# Patient Record
Sex: Female | Born: 1950 | Race: White | Hispanic: No | Marital: Married | Smoking: Never smoker
Health system: Southern US, Community
[De-identification: ages and names within clinical notes are randomized; demographics above are authoritative.]

## PROBLEM LIST (undated history)

## (undated) DIAGNOSIS — I10 Essential (primary) hypertension: Secondary | ICD-10-CM

---

## 2006-04-20 ENCOUNTER — Emergency Department (HOSPITAL_COMMUNITY): Admission: EM | Admit: 2006-04-20 | Discharge: 2006-04-20 | Payer: Self-pay | Admitting: Emergency Medicine

## 2008-03-07 ENCOUNTER — Emergency Department (HOSPITAL_COMMUNITY): Admission: EM | Admit: 2008-03-07 | Discharge: 2008-03-07 | Payer: Self-pay | Admitting: Emergency Medicine

## 2011-12-05 ENCOUNTER — Other Ambulatory Visit (HOSPITAL_COMMUNITY): Payer: Self-pay | Admitting: Family Medicine

## 2011-12-05 DIAGNOSIS — Z1231 Encounter for screening mammogram for malignant neoplasm of breast: Secondary | ICD-10-CM

## 2013-12-10 ENCOUNTER — Other Ambulatory Visit: Payer: Self-pay | Admitting: Nurse Practitioner

## 2013-12-10 ENCOUNTER — Ambulatory Visit
Admission: RE | Admit: 2013-12-10 | Discharge: 2013-12-10 | Disposition: A | Discharge status: Home / Self Care | Payer: BC Managed Care – PPO | Source: Ambulatory Visit | Attending: Nurse Practitioner | Admitting: Nurse Practitioner

## 2013-12-10 DIAGNOSIS — R52 Pain, unspecified: Secondary | ICD-10-CM

## 2015-12-19 IMAGING — CR DG HIP COMPLETE 2+V*R*
2 series · 2 of 2 positions shown · non-contrast
Comparison: None.

CLINICAL DATA: Increase pain, no trauma

EXAM:
RIGHT HIP - COMPLETE 2+ VIEW

[view not recorded (1 of 2)]
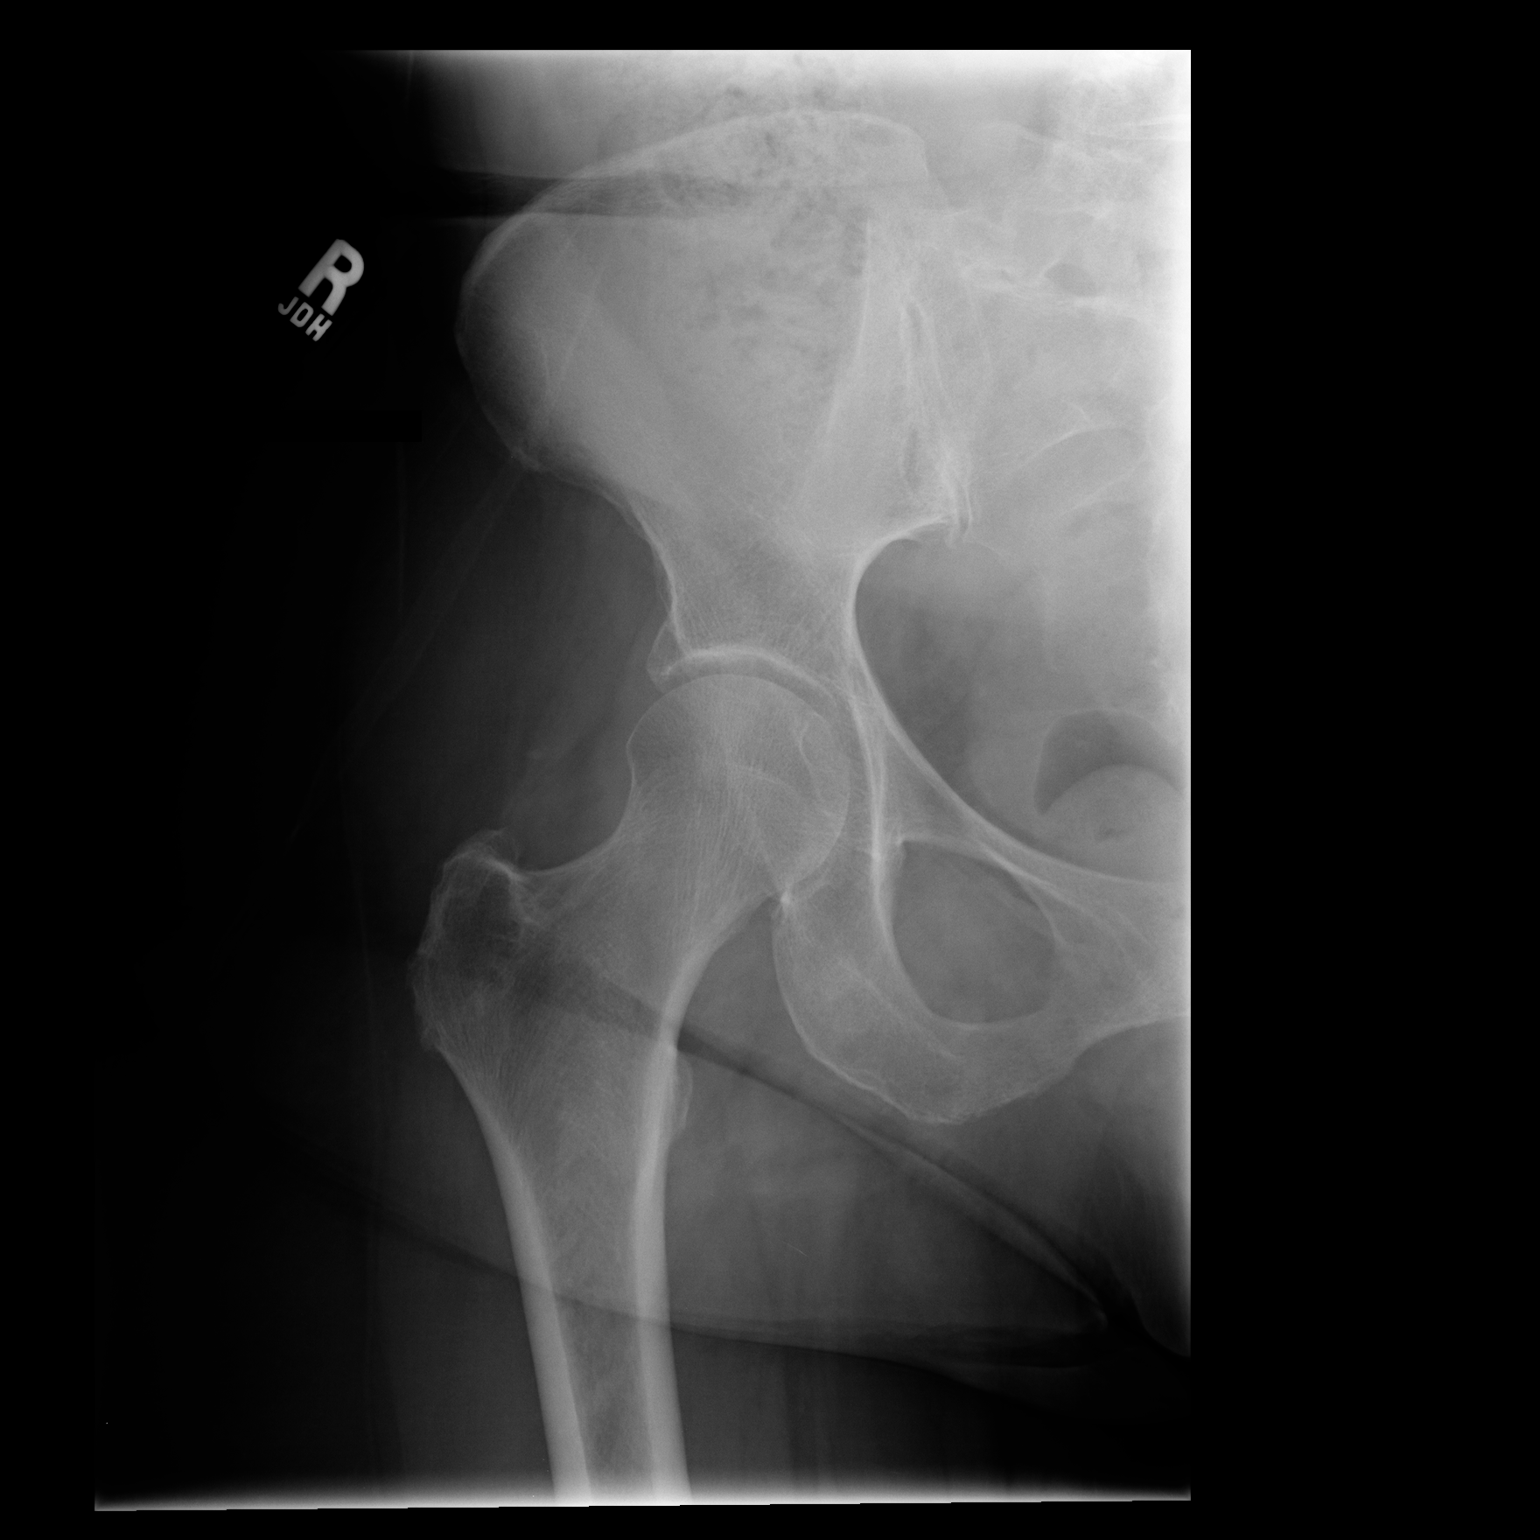

[view not recorded (2 of 2)]
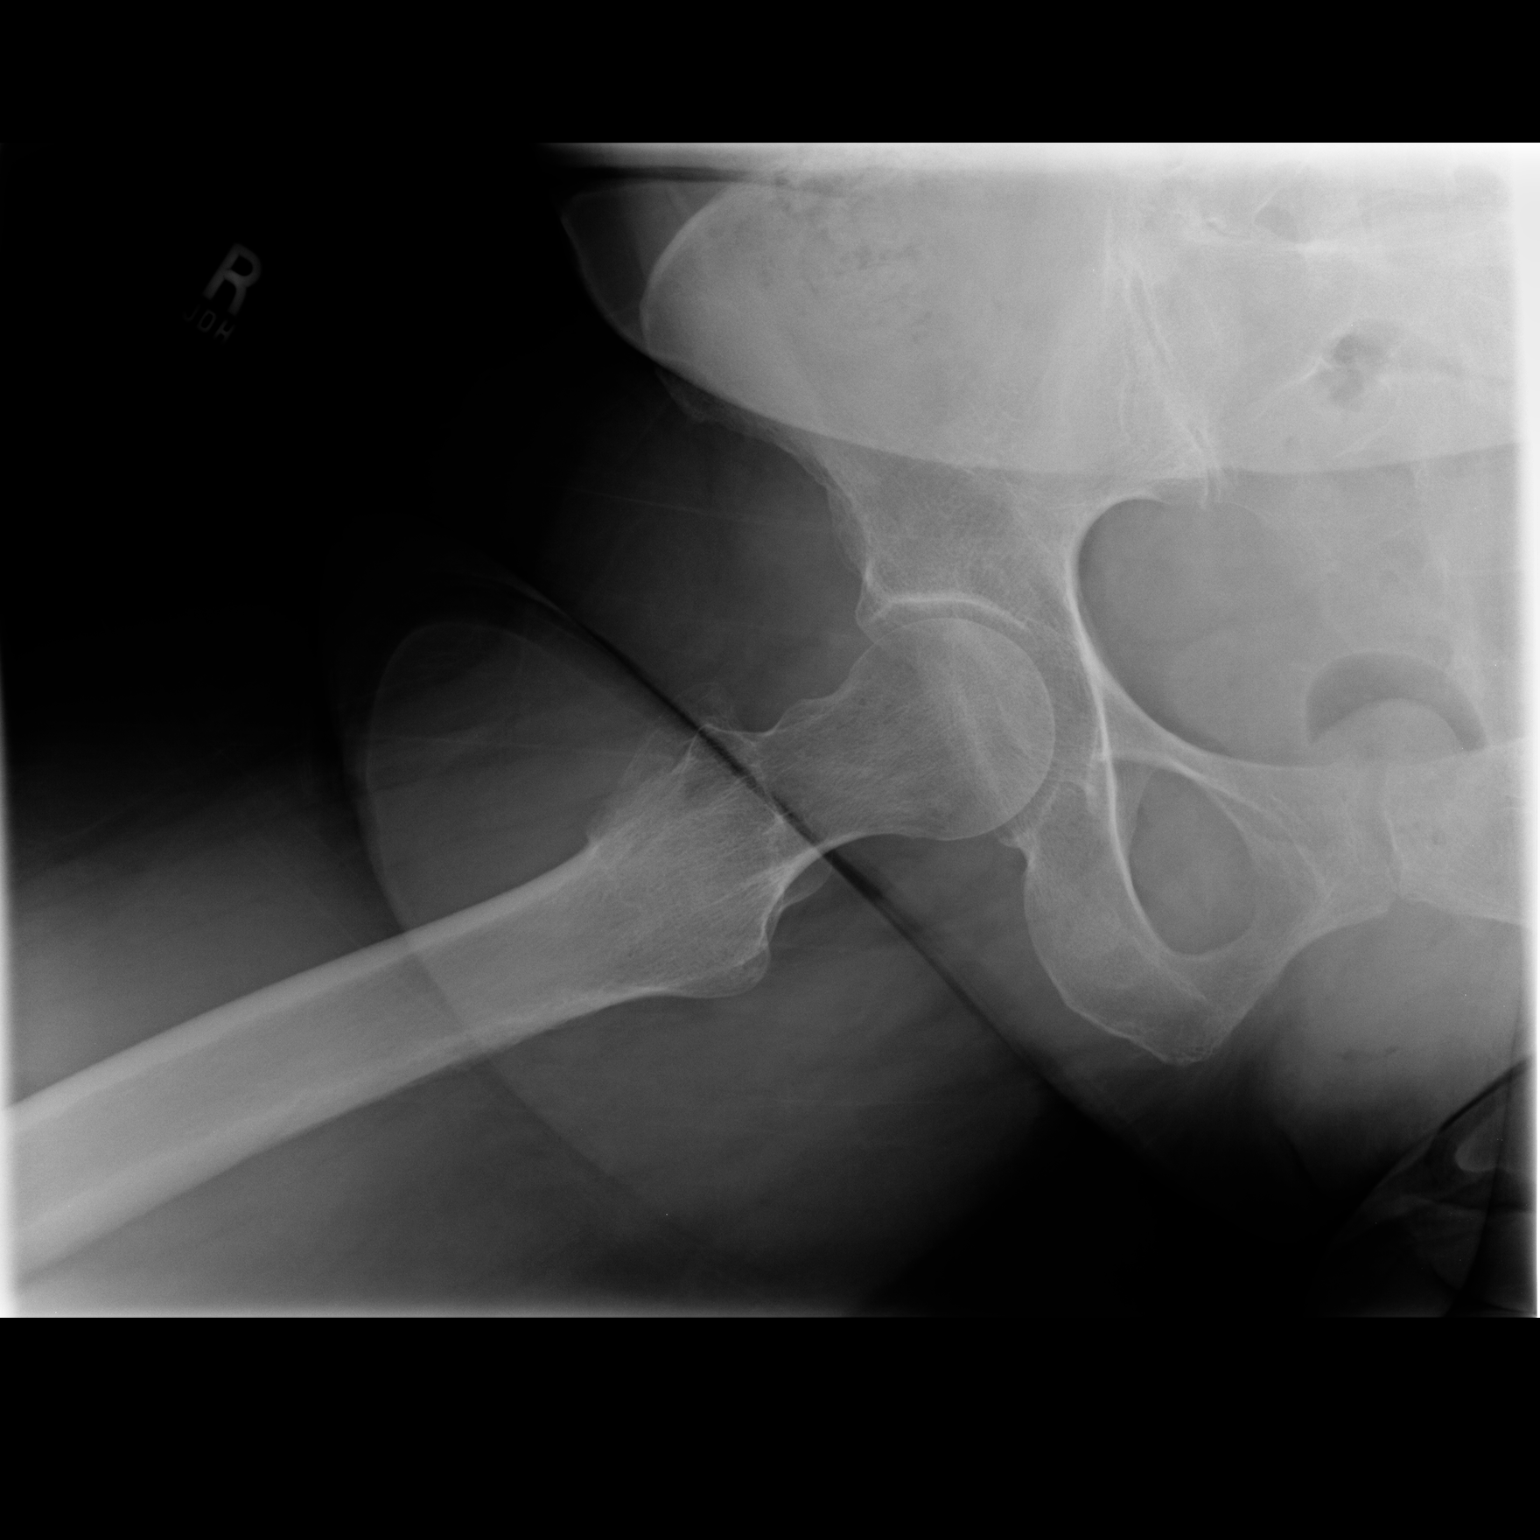

[2 of 2 positions shown; findings below may reference images not displayed]

FINDINGS: Very little degenerative joint disease is noted involving the right
hip with fairly well preserved joint space. The right ramus appears
intact and the right SI joint is corticated.
IMPRESSION: Negative.

## 2017-07-07 ENCOUNTER — Ambulatory Visit (INDEPENDENT_AMBULATORY_CARE_PROVIDER_SITE_OTHER): Payer: Worker's Compensation

## 2017-07-07 ENCOUNTER — Ambulatory Visit (HOSPITAL_COMMUNITY)
Admission: EM | Admit: 2017-07-07 | Discharge: 2017-07-07 | Disposition: A | Discharge status: Home / Self Care | Payer: Worker's Compensation | Attending: Physician Assistant | Admitting: Physician Assistant

## 2017-07-07 ENCOUNTER — Encounter (HOSPITAL_COMMUNITY): Payer: Self-pay | Admitting: Family Medicine

## 2017-07-07 DIAGNOSIS — S92354A Nondisplaced fracture of fifth metatarsal bone, right foot, initial encounter for closed fracture: Secondary | ICD-10-CM

## 2017-07-07 DIAGNOSIS — S92902A Unspecified fracture of left foot, initial encounter for closed fracture: Secondary | ICD-10-CM

## 2017-07-07 HISTORY — DX: Essential (primary) hypertension: I10

## 2017-07-07 NOTE — ED Triage Notes (Addendum)
Pt here for fall yesterday at work. sts that her foot was numb and she fell and injured right foot. Pt limping. She has been soaking it and icing it.

## 2017-07-07 NOTE — ED Provider Notes (Signed)
[facility]    CSN: [id number] Arrival date & time: 07/07/17  1516     History   Chief Complaint Chief Complaint  Patient presents with  . Fall    HPI Debbie Garcia is a 67 y.o. female.   Who presents with a work comp injury to the right foot. Patient was getting up out of her chair yesterday and twisted her right foot while falling. The pain has worsened through the night. She noted pain and swelling with ambulation. She has been icing and resting.       Past Medical History:  Diagnosis Date  . Hypertension     There are no active problems to display for this patient.   History reviewed. No pertinent surgical history.  OB History    No data available       Home Medications    Prior to Admission medications   Medication Sig Start Date End Date Taking? Authorizing Provider  allopurinol (ZYLOPRIM) 300 MG tablet Take 300 mg by mouth daily.   Yes [provider]  amLODipine (NORVASC) 10 MG tablet Take 10 mg by mouth daily.   Yes [provider]  cholecalciferol (VITAMIN D) 1000 units tablet Take 1,000 Units by mouth daily.   Yes [provider]  metoprolol tartrate (LOPRESSOR) 50 MG tablet Take 50 mg by mouth 2 (two) times daily.   Yes [provider]  valsartan (DIOVAN) 80 MG tablet Take 80 mg by mouth daily.   Yes [provider]    Family History History reviewed. No pertinent family history.  Social History Social History   Tobacco Use  . Smoking status: Never Smoker  . Smokeless tobacco: Never Used  Substance Use Topics  . Alcohol use: Not on file  . Drug use: Not on file     Allergies   Patient has no known allergies.   Review of Systems Review of Systems  All other systems reviewed and are negative.    Physical Exam Triage Vital Signs ED Triage Vitals [07/07/17 1641]  Enc Vitals Group     BP      Pulse      Resp      Temp      Temp src      SpO2      Weight    Height      Head Circumference      Peak Flow      Pain Score 5     Pain Loc      Pain Edu?      Excl. in GC?    No data found.  Updated Vital Signs BP 140/80   Pulse 68   Temp 98.3 F (36.8 C)   Resp 18   SpO2 97%   Visual Acuity Right Eye Distance:   Left Eye Distance:   Bilateral Distance:    Right Eye Near:   Left Eye Near:    Bilateral Near:     Physical Exam  Constitutional: She appears well-developed and well-nourished.  Musculoskeletal:  Right foot with ecchymosis and swelling along the hindfoot with swelling locally extending to the forefoot. Tenderness to palpation. Full ROM of the ankle without tenderness  Neurological: She is alert.  Skin: Skin is warm and dry.  Psychiatric: Her behavior is normal.  Nursing note and vitals reviewed.    UC Treatments / Results  Labs (all labs ordered are listed, but only abnormal results are displayed) Labs Reviewed - No data to  display  EKG  EKG Interpretation None       Radiology Dg Foot Complete Right  Result Date: 07/07/2017 CLINICAL DATA:  Fall yesterday with lateral right foot pain EXAM: RIGHT FOOT COMPLETE - 3+ VIEW COMPARISON:  None. FINDINGS: There is a transverse non articular fracture of the proximal metaphysis of the right fifth metatarsal with surrounding soft tissue swelling. No additional fracture. No dislocation. No suspicious focal osseous lesion. Severe Garcia metatarsal-phalangeal joint osteoarthritis. No radiopaque foreign body. IMPRESSION: Proximal right fifth metatarsal nondisplaced non articular fracture. Electronically Signed   By: Delbert Phenix M.D.   On: 07/07/2017 17:17    Procedures Procedures (including critical care time)  Medications Ordered in UC Medications - No data to display   Initial Impression / Assessment and Plan / UC Course  I have reviewed the triage vital signs and the nursing notes.  Pertinent labs & imaging results that were available during my care of the patient  were reviewed by me and considered in my medical decision making (see chart for details).     Treat with post op shoe with progressive weight bear. Acutely ice and elevate. FU with [facility] on Monday.   Final Clinical Impressions(s) / UC Diagnoses   Final diagnoses:  Closed fracture of left foot, initial encounter    ED Discharge Orders    None       Controlled Substance Prescriptions [place] Controlled Substance Registry consulted? Not Applicable   Sharin Mons 07/07/17 1733

## 2017-07-07 NOTE — Discharge Instructions (Signed)
Please walk in the shoe vs bare foot. Ice and elevate over the next 24 hours. May take Tylenol as needed for pain. After 1 week ok to try to progress to a shoe as tolerated. FU if worsens. Because you are worker's comp, the company requires you to go [facility] to follow up on next business day. Feel better.

## 2019-03-26 ENCOUNTER — Other Ambulatory Visit: Payer: Self-pay

## 2019-03-26 ENCOUNTER — Ambulatory Visit (INDEPENDENT_AMBULATORY_CARE_PROVIDER_SITE_OTHER): Payer: BC Managed Care – PPO | Admitting: Family Medicine

## 2019-03-26 ENCOUNTER — Encounter: Payer: Self-pay | Admitting: Family Medicine

## 2019-03-26 DIAGNOSIS — G8929 Other chronic pain: Secondary | ICD-10-CM | POA: Diagnosis not present

## 2019-03-26 DIAGNOSIS — M545 Low back pain, unspecified: Secondary | ICD-10-CM

## 2019-03-26 DIAGNOSIS — M25559 Pain in unspecified hip: Secondary | ICD-10-CM | POA: Diagnosis not present

## 2019-03-26 MED ORDER — TIZANIDINE HCL 2 MG PO TABS: 2.0000 mg | ORAL_TABLET | Freq: Every evening | ORAL | 1 refills | Status: AC | PRN

## 2019-03-26 MED ORDER — NABUMETONE 750 MG PO TABS: 750.0000 mg | ORAL_TABLET | Freq: Two times a day (BID) | ORAL | 6 refills | Status: AC | PRN

## 2019-03-26 NOTE — Progress Notes (Signed)
   Office Visit Note   Patient: Debbie Garcia           Date of Birth: [birth year]           MRN: [id number] Visit Date: 03/26/2019 Requested by: No referring provider defined for this encounter. PCP: Patient, No Pcp Per  Subjective: Chief Complaint  Patient presents with  . Lower Back - Pain    Intermittent pain off & on x years. Main complaint is sharp spasms with certain movements. Has been wearing a back brace at work x 2 wks - not helping (works in [employer]).  . Right Hip - Pain    Pain started 2 months ago with pain in the groin and into the lateral hip.    HPI: She is here with low back and right hip pain.  She has had intermittent low back pain over the years but in the past 2 or 3 weeks it has become more constant.  Pain in the upper lumbar paraspinous muscles worse when standing straight up after bending forward, or when lying down in bed and trying to move.  Her back will go into spasms sometimes which are severely painful.  She does not have any radicular pain.  She has used ibuprofen and many topical remedies with minimal improvement.  She has had intermittent right hip pain over the past few months with pain in the groin area.  It feels a lot better lately.  Denies fevers or chills, denies history of osteoporosis or loss of height.  Denies bowel or bladder dysfunction.                ROS:   All other systems were reviewed and are negative.  Objective: Vital Signs: There were no vitals taken for this visit.  Physical Exam:  General:  Alert and oriented, in no acute distress. Pulm:  Breathing unlabored. Psy:  Normal mood, congruent affect. Skin: No visible rash. Low back: No tenderness over the lumbar spinous processes.  She has very tight upper lumbar paraspinous muscles with symptomatic trigger points that seem to reproduce her pain.  Negative bilateral straight leg raise, lower extremity strength and reflexes are normal. Right hip: She has good passive range of  motion with flexion, internal and external rotation and no significant pain at the extremes.  Imaging: None today.  Hip x-rays from 2015 show well-preserved joint space with no significant arthritis.  Lumbar x-rays from 2007 were unremarkable.   Assessment & Plan: 1.  Myofascial low back pain. -We will try physical therapy at [facility] in [place]. -Zanaflex at night for muscle spasm, Relafen for inflammation. -If symptoms do not improve over the next 3 to 6 weeks, then x-rays and possibly MRI scan of the lumbar spine.     Procedures: No procedures performed  No notes on file     PMFS History: There are no active problems to display for this patient.  Past Medical History:  Diagnosis Date  . Hypertension     History reviewed. No pertinent family history.  History reviewed. No pertinent surgical history. Social History   Occupational History  . Not on file  Tobacco Use  . Smoking status: Never Smoker  . Smokeless tobacco: Never Used  Substance and Sexual Activity  . Alcohol use: Not on file  . Drug use: Not on file  . Sexual activity: Not on file

## 2019-07-16 IMAGING — DX DG FOOT COMPLETE 3+V*R*
3 series · 3 of 3 positions shown · non-contrast
Comparison: None.

CLINICAL DATA: Fall yesterday with lateral right foot pain

EXAM:
RIGHT FOOT COMPLETE - 3+ VIEW

[foot ap]
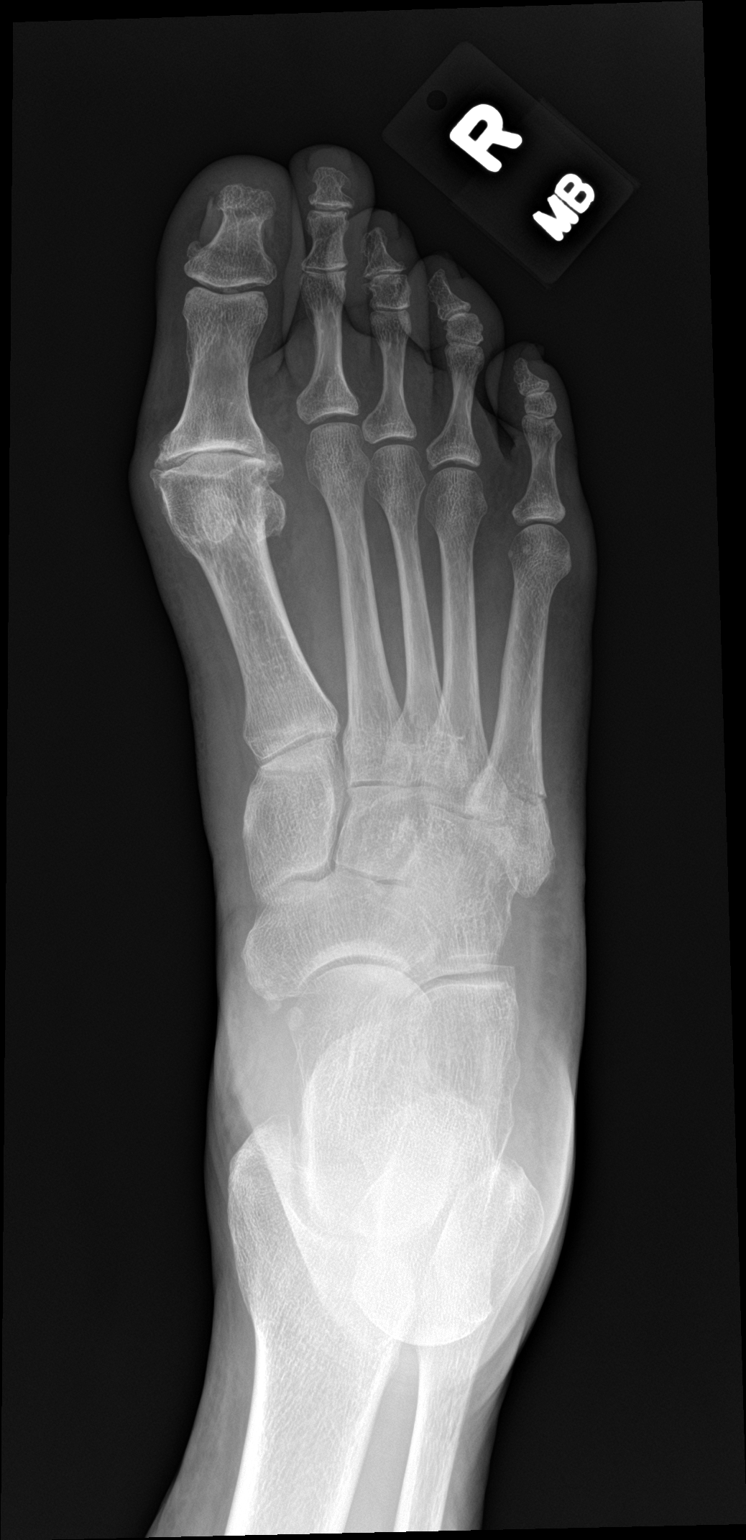

[foot obl]
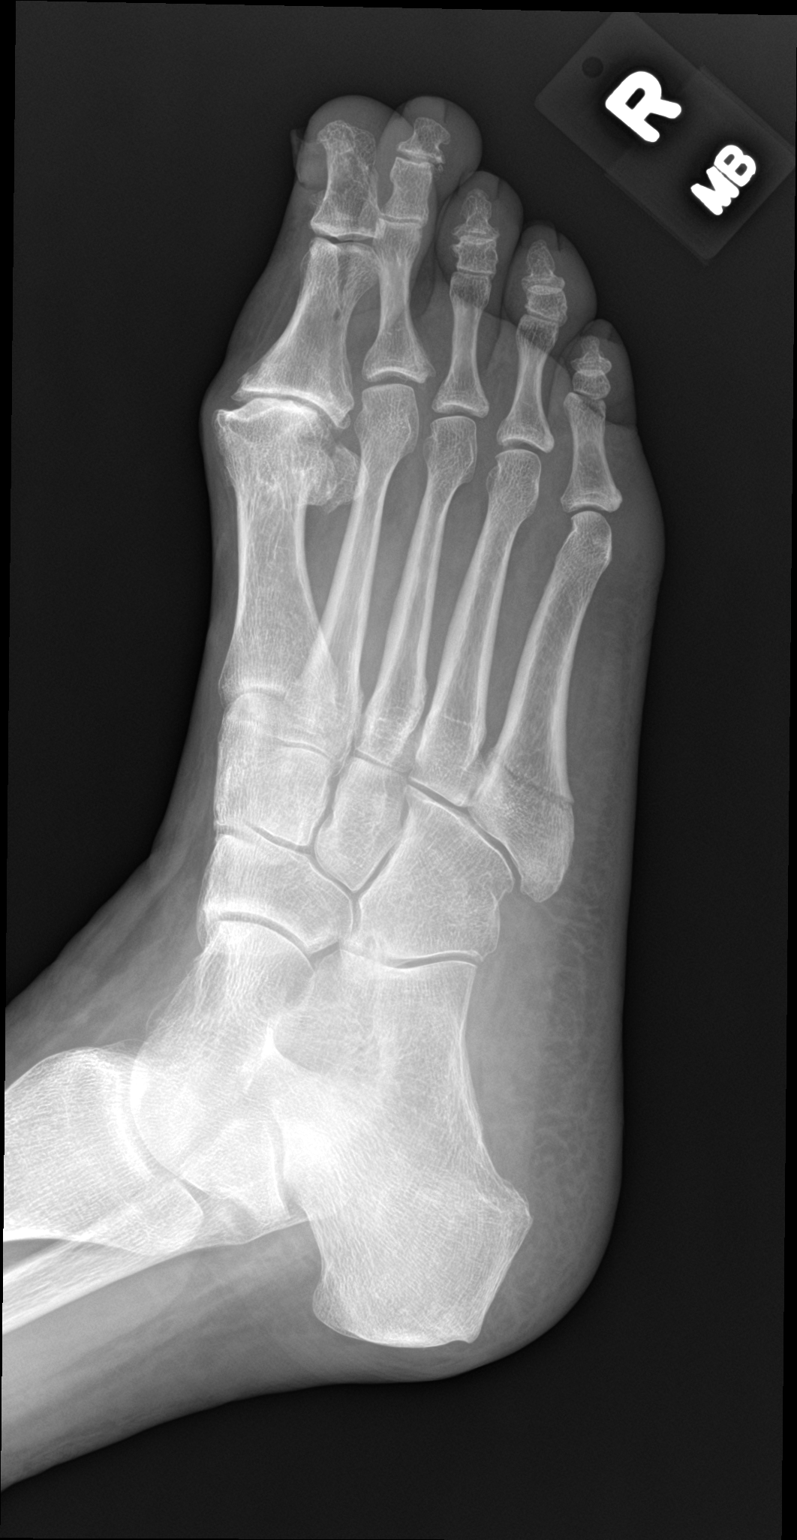

[foot lat]
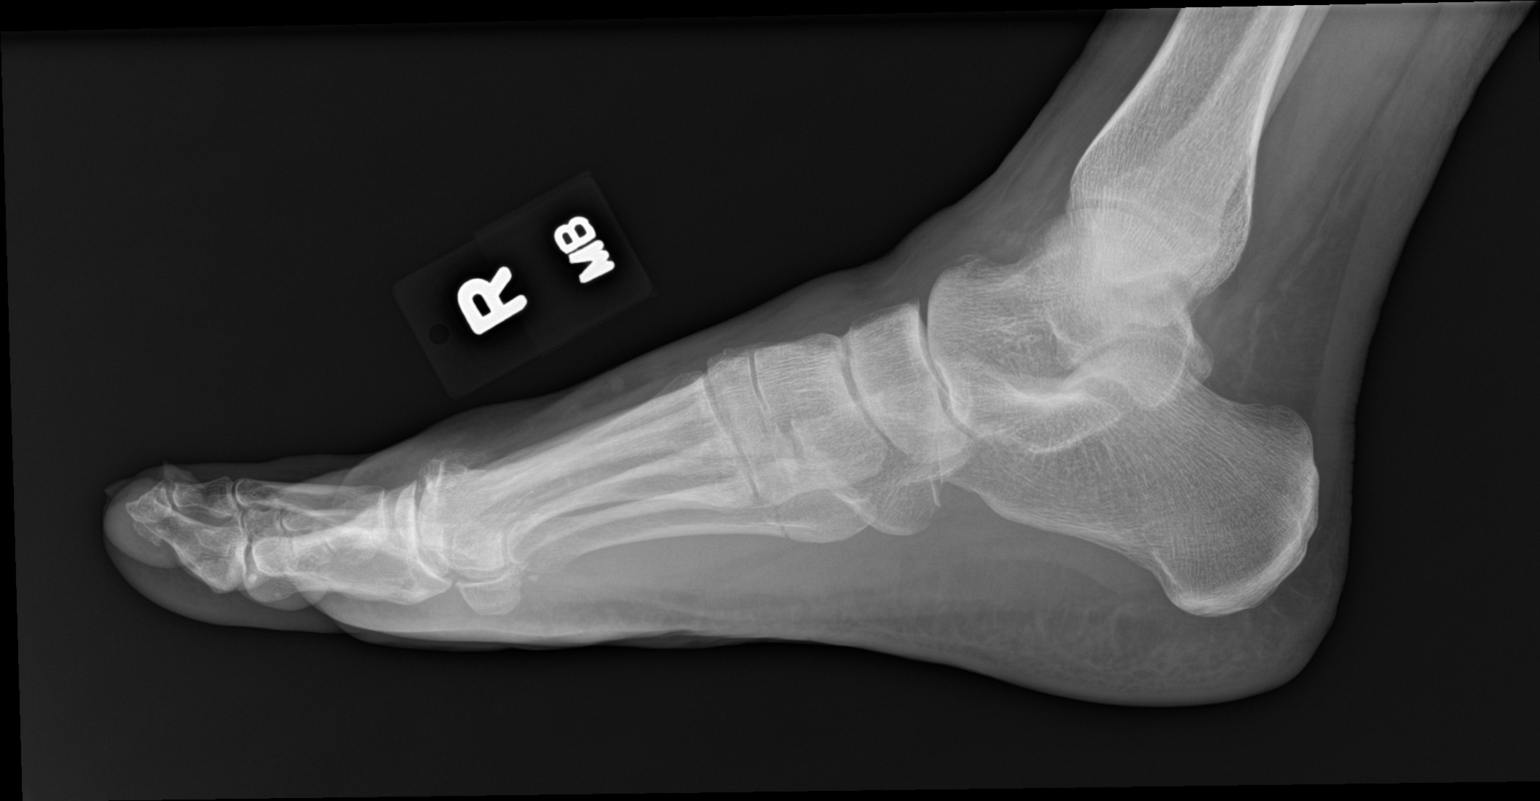

[3 of 3 positions shown; findings below may reference images not displayed]

FINDINGS: There is a transverse non articular fracture of the proximal
metaphysis of the right fifth metatarsal with surrounding soft
tissue swelling. No additional fracture. No dislocation. No
suspicious focal osseous lesion. Severe first metatarsal-phalangeal
joint osteoarthritis. No radiopaque foreign body.
IMPRESSION: Proximal right fifth metatarsal nondisplaced non articular fracture.

## 2022-12-18 ENCOUNTER — Other Ambulatory Visit (HOSPITAL_COMMUNITY): Payer: Self-pay | Admitting: Registered Nurse

## 2022-12-18 DIAGNOSIS — E785 Hyperlipidemia, unspecified: Secondary | ICD-10-CM

## 2022-12-22 ENCOUNTER — Ambulatory Visit
Admission: RE | Admit: 2022-12-22 | Discharge: 2022-12-22 | Disposition: A | Discharge status: Home / Self Care | Payer: BC Managed Care – PPO | Source: Ambulatory Visit | Attending: Registered Nurse | Admitting: Registered Nurse

## 2022-12-22 DIAGNOSIS — E785 Hyperlipidemia, unspecified: Secondary | ICD-10-CM | POA: Insufficient documentation

## 2023-01-01 ENCOUNTER — Encounter: Payer: Self-pay | Admitting: Registered Nurse

## 2023-06-20 DIAGNOSIS — Z23 Encounter for immunization: Secondary | ICD-10-CM | POA: Diagnosis not present

## 2023-06-20 DIAGNOSIS — E785 Hyperlipidemia, unspecified: Secondary | ICD-10-CM | POA: Diagnosis not present

## 2023-06-20 DIAGNOSIS — I10 Essential (primary) hypertension: Secondary | ICD-10-CM | POA: Diagnosis not present

## 2023-12-13 DIAGNOSIS — Z0000 Encounter for general adult medical examination without abnormal findings: Secondary | ICD-10-CM | POA: Diagnosis not present

## 2023-12-13 DIAGNOSIS — I10 Essential (primary) hypertension: Secondary | ICD-10-CM | POA: Diagnosis not present

## 2023-12-13 DIAGNOSIS — E785 Hyperlipidemia, unspecified: Secondary | ICD-10-CM | POA: Diagnosis not present

## 2023-12-13 DIAGNOSIS — E7800 Pure hypercholesterolemia, unspecified: Secondary | ICD-10-CM | POA: Diagnosis not present

## 2023-12-27 DIAGNOSIS — Z0000 Encounter for general adult medical examination without abnormal findings: Secondary | ICD-10-CM | POA: Diagnosis not present

## 2023-12-27 DIAGNOSIS — M8589 Other specified disorders of bone density and structure, multiple sites: Secondary | ICD-10-CM | POA: Diagnosis not present

## 2023-12-27 DIAGNOSIS — L820 Inflamed seborrheic keratosis: Secondary | ICD-10-CM | POA: Diagnosis not present

## 2023-12-27 DIAGNOSIS — I10 Essential (primary) hypertension: Secondary | ICD-10-CM | POA: Diagnosis not present

## 2023-12-27 DIAGNOSIS — E7800 Pure hypercholesterolemia, unspecified: Secondary | ICD-10-CM | POA: Diagnosis not present

## 2024-03-20 DIAGNOSIS — Z7982 Long term (current) use of aspirin: Secondary | ICD-10-CM | POA: Diagnosis not present

## 2024-03-20 DIAGNOSIS — Z833 Family history of diabetes mellitus: Secondary | ICD-10-CM | POA: Diagnosis not present

## 2024-03-20 DIAGNOSIS — M8580 Other specified disorders of bone density and structure, unspecified site: Secondary | ICD-10-CM | POA: Diagnosis not present

## 2024-03-20 DIAGNOSIS — R011 Cardiac murmur, unspecified: Secondary | ICD-10-CM | POA: Diagnosis not present

## 2024-03-20 DIAGNOSIS — Z8249 Family history of ischemic heart disease and other diseases of the circulatory system: Secondary | ICD-10-CM | POA: Diagnosis not present

## 2024-03-20 DIAGNOSIS — H269 Unspecified cataract: Secondary | ICD-10-CM | POA: Diagnosis not present

## 2024-03-20 DIAGNOSIS — I129 Hypertensive chronic kidney disease with stage 1 through stage 4 chronic kidney disease, or unspecified chronic kidney disease: Secondary | ICD-10-CM | POA: Diagnosis not present

## 2024-03-20 DIAGNOSIS — M1990 Unspecified osteoarthritis, unspecified site: Secondary | ICD-10-CM | POA: Diagnosis not present

## 2024-03-20 DIAGNOSIS — N182 Chronic kidney disease, stage 2 (mild): Secondary | ICD-10-CM | POA: Diagnosis not present
